# Patient Record
Sex: Male | Born: 1950 | Race: White | Hispanic: No | Marital: Married | Smoking: Current every day smoker
Health system: Southern US, Community
[De-identification: ages and names within clinical notes are randomized; demographics above are authoritative.]

## PROBLEM LIST (undated history)

## (undated) DIAGNOSIS — M7989 Other specified soft tissue disorders: Secondary | ICD-10-CM

## (undated) DIAGNOSIS — I10 Essential (primary) hypertension: Secondary | ICD-10-CM

## (undated) DIAGNOSIS — R9431 Abnormal electrocardiogram [ECG] [EKG]: Secondary | ICD-10-CM

## (undated) HISTORY — DX: Essential (primary) hypertension: I10

## (undated) HISTORY — PX: VASECTOMY: SHX75

## (undated) HISTORY — PX: TONSILLECTOMY AND ADENOIDECTOMY: SUR1326

---
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
## 2019-08-02 NOTE — Progress Notes (Signed)
Cardiology Office Note:    Date:  08/03/2019   ID:  Eric SchmidtHouston Molina, [place] [birth year], MRN [id number]  PCP:  Baldo AshMartin, Michael, FNP  Cardiologist:  Norman HerrlichBrian Munley, MD   Referring MD: Baldo AshMartin, Michael, FNP  ASSESSMENT:    1. Abnormal electrocardiogram   2. Essential hypertension   3. Cigarette smoker    PLAN:    In order of problems listed above:  1. Abnormal EKG, we discussed that as a screening test unfortunately and often leaves you with nonspecific findings we discussed the option of further evaluation and for better screen for CAD he will undergo a coronary calcium score.  If high risk I would do an ischemia evaluation. 2. Hypertension stable 3. Cigarette smoking advised to cease, I am uncertain but I asked him to discuss with his PCP if he qualifies for duplex abdominal aorta entering Medicare with cigarette smoking and hypertension for abdominal aortic aneurysm.  Next appointment as needed  Medication Adjustments/Labs and Tests Ordered: Current medicines are reviewed at length with the patient today.  Concerns regarding medicines are outlined above.  Orders Placed This Encounter  Procedures  . CT CARDIAC SCORING  . EKG 12-Lead   No orders of the defined types were placed in this encounter.    Chief Complaint  Patient presents with  . Abnormal ECG    History of Present Illness:    Eric Molina is a 68 y.o. male who is being seen today for the evaluation of an abnormal EKG at the request of Baldo AshMartin, Michael, FNP.  With his welcome to Medicare examination an EKG was performed which showed nonspecific ST abnormality.  His cardiovascular risk include hypertension age and ongoing cigarette smoking he has no known history of heart disease never had a heart murmur congenital or rheumatic.  He is a vigorous active man and has no shortness of breath exercise intolerance chest pain palpitation or syncope his only complaint is chronic swelling in the right ankle.  Repeat EKG in my office  today shows sinus rhythm nonspecific conduction delay and ST abnormality.  EKG is not specific for either left ventricular hypertrophy or CAD.  He has no background history of previous electrocardiograms Past Medical History:  Diagnosis Date  . Abnormal EKG 08/03/2019  . Hypertension   . Swelling of right foot 08/03/2019    Past Surgical History:  Procedure Laterality Date  . TONSILLECTOMY AND ADENOIDECTOMY    . VASECTOMY      Current Medications: Current Meds  Medication Sig  . amLODipine (NORVASC) 10 MG tablet TAKE 1 TABLET BY MOUTH ONCE DAILY FOR 30 DAYS  . chlorthalidone (HYGROTON) 25 MG tablet TAKE 1 TABLET BY MOUTH ONCE DAILY FOR 30 DAYS     Allergies:   Patient has no known allergies.   Social History   Socioeconomic History  . Marital status: Married    Spouse name: Not on file  . Number of children: Not on file  . Years of education: Not on file  . Highest education level: Not on file  Occupational History  . Not on file  Social Needs  . Financial resource strain: Not on file  . Food insecurity    Worry: Not on file    Inability: Not on file  . Transportation needs    Medical: Not on file    Non-medical: Not on file  Tobacco Use  . Smoking status: Current Every Day Smoker    Packs/day: 1.00    Years: 45.00    Pack years:  45.00    Types: Cigarettes  . Smokeless tobacco: Never Used  Substance and Sexual Activity  . Alcohol use: Yes    Comment: very seldom  . Drug use: Never  . Sexual activity: Not on file  Lifestyle  . Physical activity    Days per week: Not on file    Minutes per session: Not on file  . Stress: Not on file  Relationships  . Social Herbalist on phone: Not on file    Gets together: Not on file    Attends religious service: Not on file    Active member of club or organization: Not on file    Attends meetings of clubs or organizations: Not on file    Relationship status: Not on file  Other Topics Concern  . Not on file   Social History Narrative  . Not on file     Family History: The patient's family history includes Breast cancer in his mother; Cancer in his father; Diabetes in his sister.  ROS:   Review of Systems  Constitution: Negative.  HENT: Negative.   Eyes: Negative.   Cardiovascular: Positive for leg swelling.  Respiratory: Negative.   Endocrine: Negative.   Hematologic/Lymphatic: Negative.   Skin: Negative.   Musculoskeletal: Negative.   Gastrointestinal: Negative.   Genitourinary: Negative.   Neurological: Negative.   Psychiatric/Behavioral: Negative.   Allergic/Immunologic: Negative.    Please see the history of present illness.     All other systems reviewed and are negative.  EKGs/Labs/Other Studies Reviewed:    The following studies were reviewed today:   EKG:  EKG is  ordered today.  The ekg ordered today is personally reviewed and demonstrates sinus rhythm nonspecific conduction delay nonspecific ST abnormality  Recent Labs: Cholesterol 188 HDL 24 LDL 103 hemoglobin 16.1 CMP is normal including renal function and liver function.  Physical Exam:    VS:  BP 112/80 (BP Location: Right Arm, Patient Position: Sitting, Cuff Size: Large)   Pulse 73   Ht 5\' 11"  (1.803 m)   Wt 223 lb (101.2 kg)   SpO2 96%   BMI 31.10 kg/m     Wt Readings from Last 3 Encounters:  08/03/19 223 lb (101.2 kg)     GEN:  Well nourished, well developed in no acute distress HEENT: Normal NECK: No JVD; No carotid bruits LYMPHATICS: No lymphadenopathy CARDIAC: RRR, no murmurs, rubs, gallops RESPIRATORY:  Clear to auscultation without rales, wheezing or rhonchi  ABDOMEN: Soft, non-tender, non-distended his abdominal aorta is not palpable  mUSCULOSKELETAL: Isolated to the left ankle left foot chronic edema; No deformity  SKIN: Warm and dry NEUROLOGIC:  Alert and oriented x 3 PSYCHIATRIC:  Normal affect     Signed, Shirlee More, MD  08/03/2019 5:06 PM    [facility]

## 2019-08-03 ENCOUNTER — Other Ambulatory Visit: Payer: Self-pay

## 2019-08-03 ENCOUNTER — Ambulatory Visit (INDEPENDENT_AMBULATORY_CARE_PROVIDER_SITE_OTHER): Payer: Medicare Other | Admitting: Cardiology

## 2019-08-03 DIAGNOSIS — I10 Essential (primary) hypertension: Secondary | ICD-10-CM | POA: Insufficient documentation

## 2019-08-03 DIAGNOSIS — M7989 Other specified soft tissue disorders: Secondary | ICD-10-CM

## 2019-08-03 DIAGNOSIS — F17210 Nicotine dependence, cigarettes, uncomplicated: Secondary | ICD-10-CM | POA: Insufficient documentation

## 2019-08-03 DIAGNOSIS — R9431 Abnormal electrocardiogram [ECG] [EKG]: Secondary | ICD-10-CM

## 2019-08-03 HISTORY — DX: Other specified soft tissue disorders: M79.89

## 2019-08-03 HISTORY — DX: Abnormal electrocardiogram (ECG) (EKG): R94.31

## 2019-08-03 NOTE — Patient Instructions (Signed)
Medication Instructions:  Your physician recommends that you continue on your current medications as directed. Please refer to the Current Medication list given to you today.  If you need a refill on your cardiac medications before your next appointment, please call your pharmacy.   Lab work: None  If you have labs (blood work) drawn today and your tests are completely normal, you will receive your results only by: Marland Kitchen MyChart Message (if you have MyChart) OR . A paper copy in the mail If you have any lab test that is abnormal or we need to change your treatment, we will call you to review the results.  Testing/Procedures: You had an EKG today.   You have been scheduled for a CT cardiac scoring at the [facility] office on Tuesday, 09/11/2019 at 1:00 pm. The address is [address] [place], [place] [address]. Please arrive at 12:45 for testing. This test is not covered by insurance and will cost $150.00 out of pocket. Please bring the payment to your appointment.  Follow-Up: At [facility], you and your health needs are our priority.  As part of our continuing mission to provide you with exceptional heart care, we have created designated Provider Care Teams.  These Care Teams include your primary Cardiologist (physician) and Advanced Practice Providers (APPs -  Physician Assistants and Nurse Practitioners) who all work together to provide you with the care you need, when you need it. You will need a follow up appointment as needed if symptoms worsen or fail to improve.

## 2019-09-11 ENCOUNTER — Other Ambulatory Visit: Payer: Self-pay

## 2019-09-11 ENCOUNTER — Ambulatory Visit (INDEPENDENT_AMBULATORY_CARE_PROVIDER_SITE_OTHER)
Admission: RE | Admit: 2019-09-11 | Discharge: 2019-09-11 | Disposition: A | Discharge status: Home / Self Care | Payer: Self-pay | Source: Ambulatory Visit | Attending: Cardiology | Admitting: Cardiology

## 2019-09-11 DIAGNOSIS — R9431 Abnormal electrocardiogram [ECG] [EKG]: Secondary | ICD-10-CM

## 2020-10-20 IMAGING — CT CT HEART SCORING
2 series · 15 of 20 positions shown, 17 images · non-contrast
Comparison: None.
COMPARISON: None.

Addendum:
EXAM:
OVER-READ INTERPRETATION  CT CHEST

The following report is an over-read performed by radiologist Dr.
Ebadat Tiger [REDACTED] on 09/11/2019. This over-read
does not include interpretation of cardiac or coronary anatomy or
pathology. The calcium score interpretation by the cardiologist is
attached.
CLINICAL DATA: Risk stratification
Coronary Calcium Score
TECHNIQUE: The patient was scanned on a Siemens Force scanner. Axial
non-contrast 3 mm slices were carried out through the heart. The
data set was analyzed on a dedicated work station and scored using
the Agatson method.

[Series 2: casc 3.0 i36f 2 bestdiast 68 % · axial · 0.43mm/px · z∈[-297,-171]mm · 8 of 55 slices shown, 10 images]
[im 7/55  vessel]
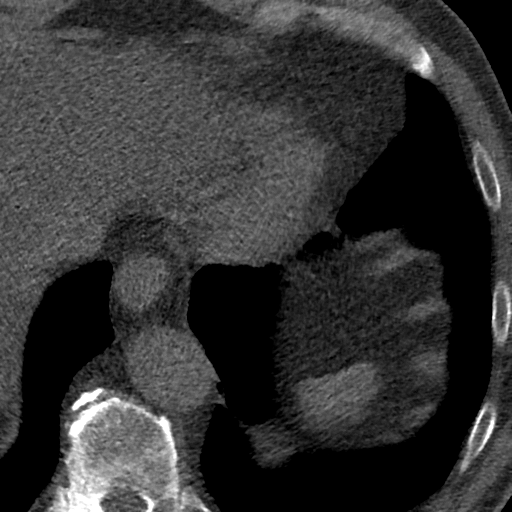
[im 7/55  lung]
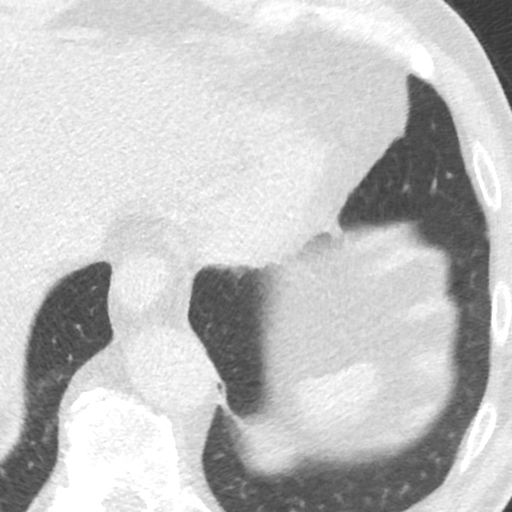
[im 13/55  vessel]
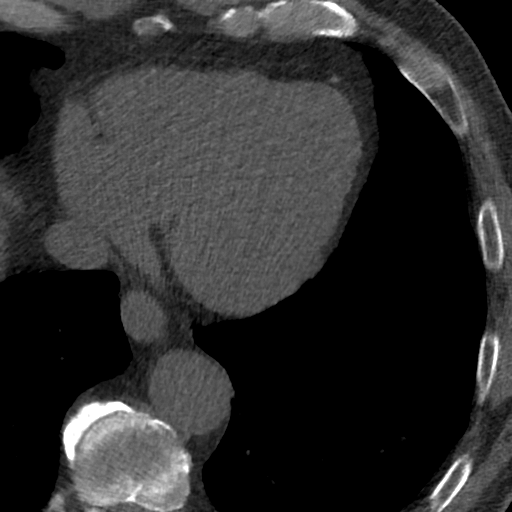
[im 19/55  vessel]
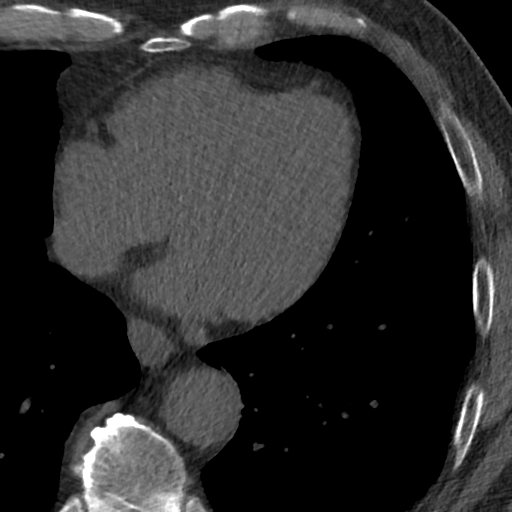
[im 25/55  vessel]
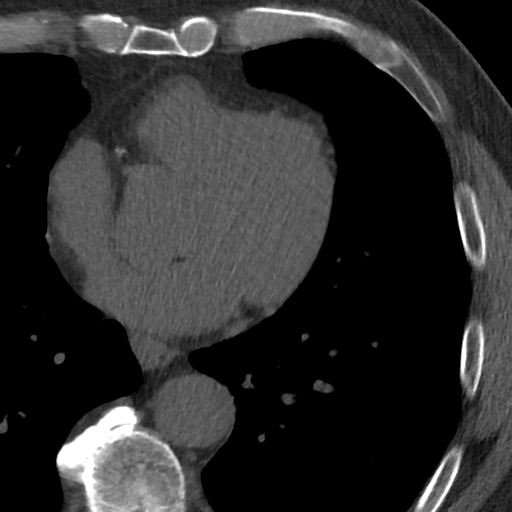
[im 31/55  vessel]
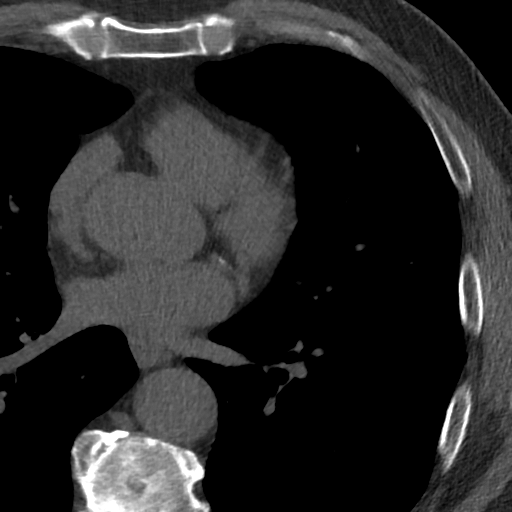
[im 31/55  lung]
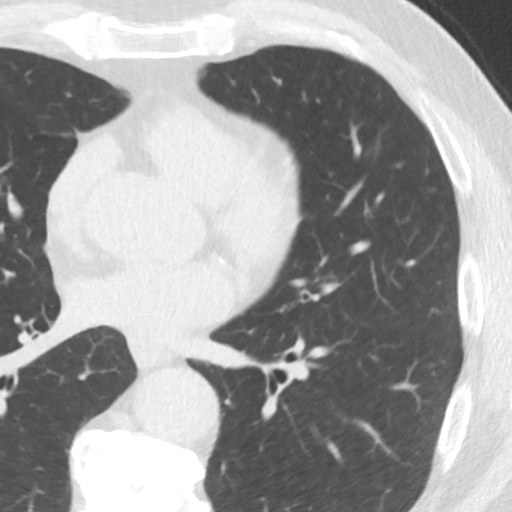
[im 37/55  vessel]
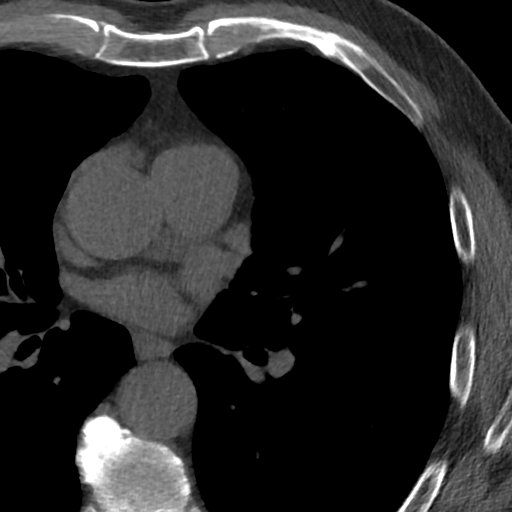
[im 43/55  vessel]
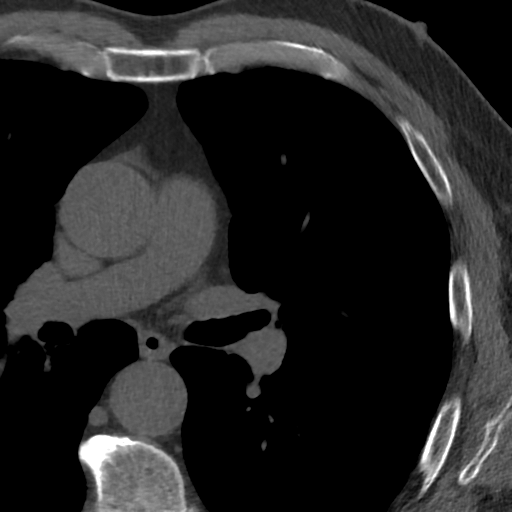
[im 49/55  vessel]
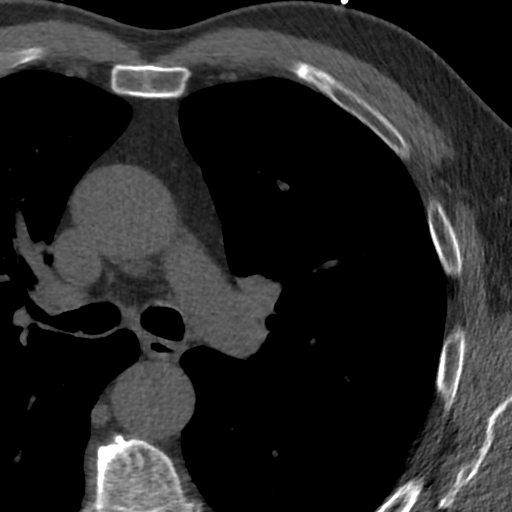

[Series 4: lung st 70 % · axial · 0.75mm/px · z∈[-296,-188]mm · 7 of 55 slices shown]
[im 7/55  lung]
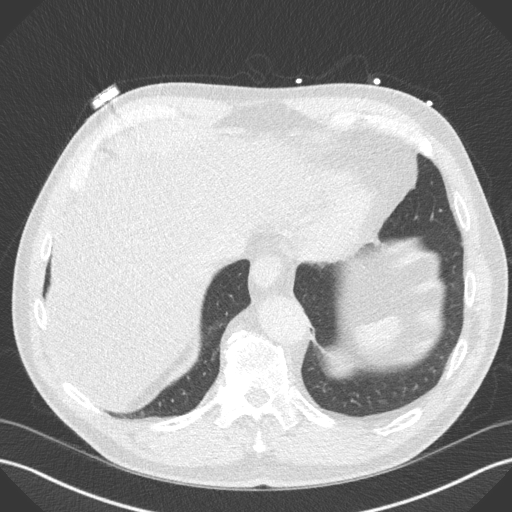
[im 13/55  lung]
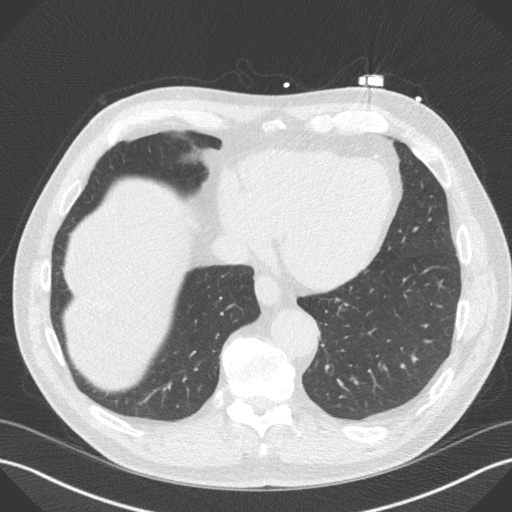
[im 19/55  lung]
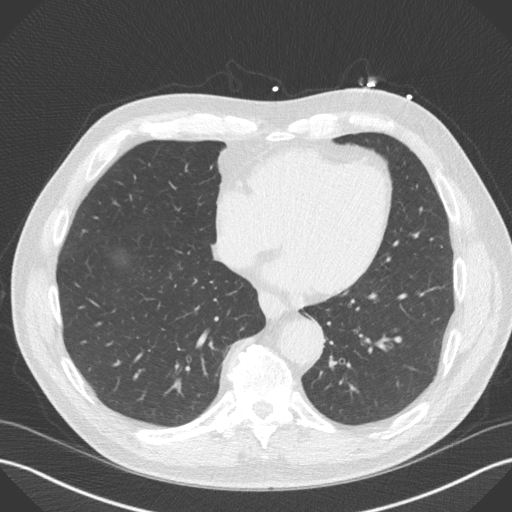
[im 25/55  lung]
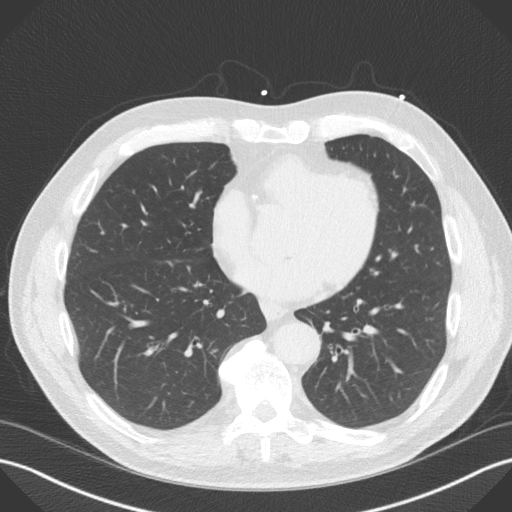
[im 31/55  lung]
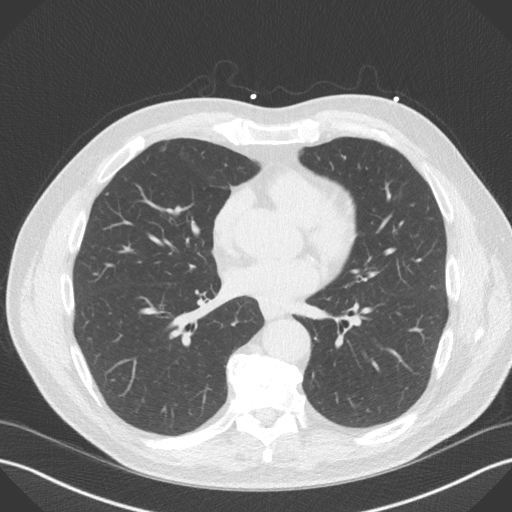
[im 37/55  lung]
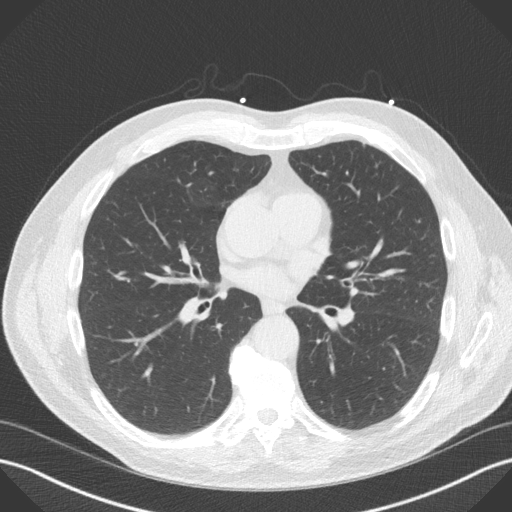
[im 43/55  lung]
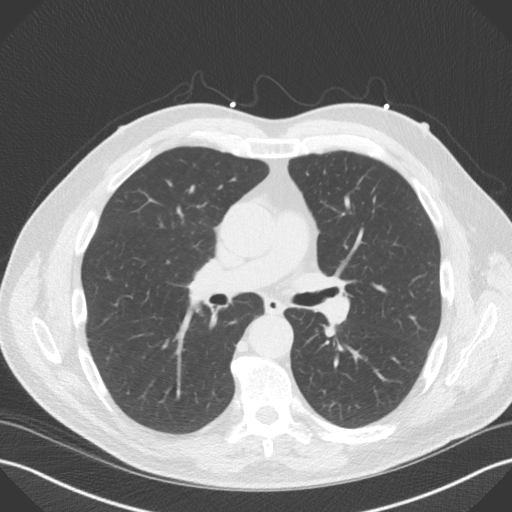

[15 of 20 positions shown; findings below may reference images not displayed]

FINDINGS: Vascular: Tortuous thoracic aorta.  Aortic atherosclerosis.

Mediastinum/Nodes: No imaged thoracic adenopathy.

Lungs/Pleura: No imaged pleural fluid. Moderate centrilobular
emphysema. Right-sided pulmonary nodules are identified on series 3,
including at maximally 5 mm on [DATE].

Upper Abdomen: Marked hepatic steatosis. Normal imaged portions of
the spleen, stomach.

Musculoskeletal: Moderate thoracic spondylosis.
IMPRESSION: 1.  No acute findings in the imaged extracardiac chest.
2. Aortic atherosclerosis (RMZAB-0LQ.Q) and emphysema (RMZAB-JG7.R).
3. pulmonary nodules of maximally 5 mm. Given emphysema, chest CT
follow-up at 12 months should be considered. Alternatively, complete
diagnostic chest CT imaging could be performed as a baseline more
acutely. This recommendation follows the consensus statement:
Guidelines for Management of Incidental Pulmonary Nodules Detected
on CT Images:From the [HOSPITAL] 2115; published online
before print (10.1148/[id number]).
4. Hepatic steatosis.
FINDINGS: Non-cardiac: See separate report from [REDACTED].

Ascending Aorta: Mild ascending aortic aneurysm measuring 41 mm.

Pericardium: Normal.

Coronary arteries: Normal origin.
IMPRESSION: 1. Coronary calcium score of 217. This was 66 percentile for age and
sex matched control.

2. Mild ascending aortic aneurysm measuring 41 mm. Annual imaging
with CTA or MRA is recommended.

*** End of Addendum ***
EXAM:
OVER-READ INTERPRETATION  CT CHEST

The following report is an over-read performed by radiologist Dr.
Ebadat Tiger [REDACTED] on 09/11/2019. This over-read
does not include interpretation of cardiac or coronary anatomy or
pathology. The calcium score interpretation by the cardiologist is
attached.
FINDINGS: Vascular: Tortuous thoracic aorta.  Aortic atherosclerosis.

Mediastinum/Nodes: No imaged thoracic adenopathy.

Lungs/Pleura: No imaged pleural fluid. Moderate centrilobular
emphysema. Right-sided pulmonary nodules are identified on series 3,
including at maximally 5 mm on [DATE].

Upper Abdomen: Marked hepatic steatosis. Normal imaged portions of
the spleen, stomach.

Musculoskeletal: Moderate thoracic spondylosis.
IMPRESSION: 1.  No acute findings in the imaged extracardiac chest.
2. Aortic atherosclerosis (RMZAB-0LQ.Q) and emphysema (RMZAB-JG7.R).
3. pulmonary nodules of maximally 5 mm. Given emphysema, chest CT
follow-up at 12 months should be considered. Alternatively, complete
diagnostic chest CT imaging could be performed as a baseline more
acutely. This recommendation follows the consensus statement:
Guidelines for Management of Incidental Pulmonary Nodules Detected
on CT Images:From the [HOSPITAL] 2115; published online
before print (10.1148/[id number]).
4. Hepatic steatosis.

## 2023-01-26 ENCOUNTER — Ambulatory Visit: Payer: Medicare Other | Admitting: Internal Medicine

## 2023-01-26 ENCOUNTER — Encounter: Payer: Self-pay | Admitting: Internal Medicine

## 2023-01-26 VITALS — BP 128/86 | HR 69 | Temp 97.3°F | Resp 18 | Ht 71.0 in | Wt 225.8 lb

## 2023-01-26 DIAGNOSIS — Z87891 Personal history of nicotine dependence: Secondary | ICD-10-CM

## 2023-01-26 DIAGNOSIS — E7800 Pure hypercholesterolemia, unspecified: Secondary | ICD-10-CM

## 2023-01-26 DIAGNOSIS — I10 Essential (primary) hypertension: Secondary | ICD-10-CM

## 2023-01-26 DIAGNOSIS — F17210 Nicotine dependence, cigarettes, uncomplicated: Secondary | ICD-10-CM

## 2023-01-26 MED ORDER — LOSARTAN POTASSIUM 100 MG PO TABS: 100.0000 mg | ORAL_TABLET | Freq: Every day | ORAL | 3 refills | Status: DC

## 2023-01-26 NOTE — Assessment & Plan Note (Signed)
We will check his FLP today.   He is not on pravastatin and we will see what his cholesterol is doing.

## 2023-01-26 NOTE — Assessment & Plan Note (Signed)
His BP is currently controlled.  We will continue on his lisinopril.

## 2023-01-26 NOTE — Assessment & Plan Note (Signed)
I had a discussion with him regarding tobacco cessation.

## 2023-01-26 NOTE — Progress Notes (Signed)
Office Visit  Subjective   Patient ID: Eric Molina   DOB: 10-Apr-1951   Age: 72 y.o.   MRN: [id number]   Chief Complaint Chief Complaint  Patient presents with   Follow-up    Fasting     History of Present Illness The patient is a 72 year old Caucasian/White male who presents for a follow-up evaluation of hypertension.  Since his last visit, he has not had any problems.  The patient does not check his blood pressure at home. The patient's current medications include: losartan 114m daily.  He has been on amlodipine in the past but this causes pruritis.  The patient denies any headache, visual changes, dizziness, lightheadness, chest pain, shortness of breath, weakness/numbness, and edema. He reports there have been no other symptoms noted.   Eric Molina returns today for routine followup on his cholesterol.  In 01/2022, his ASCVD score was 29.5% where we started him on pravastatin.  We also noted he had prediabetes.  I wanted him to start pravastatin, cut back on sugars and fat and exercise.   Overall, he states he is doing well and is without any complaints or problems at this time. He specifically denies abdominal pain, nausea, vomiting, diarrhea, and fatigue. He remains on dietary management as well as a regular exercise program but he has not been taking the pravastatin and he stopped it due to myalgias.   He is fasting in anticipation for labs today.   He does have a history of tobacco abuse where he started smoking at age 72  He smokes 1/2 ppd.  He does have a mild chronic cough but no SOB, wheezing, or hemoptysis.  He has tried chantix in the past but this made him have nightmares.  He has never tried nicotine patches.          Past Medical History Past Medical History:  Diagnosis Date   Abnormal EKG 08/03/2019   Hypertension    Swelling of right foot 08/03/2019     Allergies Allergies  Allergen Reactions   Amlodipine Rash     Medications  Current Outpatient  Medications:    losartan (COZAAR) 100 MG tablet, Take 100 mg by mouth daily., Disp: , Rfl:    pravastatin (PRAVACHOL) 40 MG tablet, Take 40 mg by mouth daily. (Patient not taking: Reported on 01/26/2023), Disp: , Rfl:    Review of Systems Review of Systems  Constitutional:  Negative for chills and fever.  Eyes:  Negative for blurred vision and double vision.  Respiratory:  Negative for cough and shortness of breath.   Cardiovascular:  Negative for chest pain, palpitations and leg swelling.  Gastrointestinal:  Negative for abdominal pain, constipation, diarrhea, nausea and vomiting.  Musculoskeletal:  Negative for myalgias.  Neurological:  Negative for dizziness, weakness and headaches.  Psychiatric/Behavioral:  Negative for depression. The patient is not nervous/anxious.        Objective:    Vitals BP 128/86 (BP Location: Left Arm, Patient Position: Sitting, Cuff Size: Normal)   Pulse 69   Temp (!) 97.3 F (36.3 C) (Temporal)   Resp 18   Ht 5' 11"$  (1.803 m)   Wt 225 lb 12.8 oz (102.4 kg)   SpO2 98%   BMI 31.49 kg/m    Physical Examination Physical Exam Constitutional:      Appearance: Normal appearance. He is not ill-appearing.  Cardiovascular:     Rate and Rhythm: Normal rate and regular rhythm.     Pulses: Normal pulses.  Heart sounds: No murmur heard.    No friction rub. No gallop.  Pulmonary:     Effort: Pulmonary effort is normal. No respiratory distress.     Breath sounds: No wheezing, rhonchi or rales.  Abdominal:     General: Abdomen is flat. Bowel sounds are normal. There is no distension.     Palpations: Abdomen is soft.     Tenderness: There is no abdominal tenderness.  Musculoskeletal:     Right lower leg: No edema.     Left lower leg: No edema.  Skin:    General: Skin is warm and dry.     Findings: No rash.  Neurological:     General: No focal deficit present.     Mental Status: He is alert and oriented to person, place, and time.  Psychiatric:         Mood and Affect: Mood normal.        Behavior: Behavior normal.        Assessment & Plan:   Essential hypertension His BP is currently controlled.  We will continue on his lisinopril.  Cigarette smoker I had a discussion with him regarding tobacco cessation.  Hypercholesterolemia We will check his FLP today.   He is not on pravastatin and we will see what his cholesterol is doing.    Return in about 3 months (around 04/26/2023) for annual.   Townsend Roger, MD

## 2023-01-27 LAB — CMP14 + ANION GAP
ALT: 31 IU/L (ref 0–44)
AST: 21 IU/L (ref 0–40)
Albumin/Globulin Ratio: 1.2 (ref 1.2–2.2)
Albumin: 4.3 g/dL (ref 3.9–4.9)
Alkaline Phosphatase: 65 IU/L (ref 44–121)
Anion Gap: 15 mmol/L (ref 10.0–18.0)
BUN/Creatinine Ratio: 11 (ref 10–24)
BUN: 15 mg/dL (ref 8–27)
Bilirubin Total: 0.7 mg/dL (ref 0.0–1.2)
CO2: 23 mmol/L (ref 20–29)
Calcium: 9.8 mg/dL (ref 8.6–10.2)
Chloride: 101 mmol/L (ref 96–106)
Creatinine, Ser: 1.31 mg/dL — ABNORMAL HIGH (ref 0.76–1.27)
Globulin, Total: 3.6 g/dL (ref 1.5–4.5)
Glucose: 109 mg/dL — ABNORMAL HIGH (ref 70–99)
Potassium: 5 mmol/L (ref 3.5–5.2)
Sodium: 139 mmol/L (ref 134–144)
Total Protein: 7.9 g/dL (ref 6.0–8.5)
eGFR: 59 mL/min/{1.73_m2} — ABNORMAL LOW (ref >59)

## 2023-01-27 LAB — LIPID PANEL
Chol/HDL Ratio: 7.2 ratio — ABNORMAL HIGH (ref 0.0–5.0)
Cholesterol, Total: 188 mg/dL (ref 100–199)
HDL: 26 mg/dL — ABNORMAL LOW (ref >39)
LDL Chol Calc (NIH): 128 mg/dL — ABNORMAL HIGH (ref 0–99)
Triglycerides: 188 mg/dL — ABNORMAL HIGH (ref 0–149)
VLDL Cholesterol Cal: 34 mg/dL (ref 5–40)

## 2023-01-31 ENCOUNTER — Other Ambulatory Visit: Payer: Self-pay

## 2023-02-01 ENCOUNTER — Other Ambulatory Visit: Payer: Self-pay

## 2023-02-01 MED ORDER — ATORVASTATIN CALCIUM 20 MG PO TABS: 20.0000 mg | ORAL_TABLET | ORAL | 3 refills | Status: DC

## 2023-02-02 NOTE — Progress Notes (Signed)
Patient called.  Patient aware.     The patient has CKD which is unchanged.  His cholesterol is elevated.  He needs to start lipitor '20mg'$  every other day.  WE will see if this cause myalgias.

## 2023-05-12 ENCOUNTER — Encounter: Payer: Medicare Other | Admitting: Internal Medicine

## 2023-06-28 ENCOUNTER — Ambulatory Visit: Payer: Medicare Other | Admitting: Internal Medicine

## 2023-06-28 ENCOUNTER — Encounter: Payer: Self-pay | Admitting: Internal Medicine

## 2023-06-28 VITALS — BP 118/70 | HR 82 | Temp 97.8°F | Resp 16 | Ht 71.0 in | Wt 216.6 lb

## 2023-06-28 DIAGNOSIS — N529 Male erectile dysfunction, unspecified: Secondary | ICD-10-CM | POA: Insufficient documentation

## 2023-06-28 DIAGNOSIS — Z0000 Encounter for general adult medical examination without abnormal findings: Secondary | ICD-10-CM

## 2023-06-28 DIAGNOSIS — Z6830 Body mass index (BMI) 30.0-30.9, adult: Secondary | ICD-10-CM | POA: Insufficient documentation

## 2023-06-28 DIAGNOSIS — I10 Essential (primary) hypertension: Secondary | ICD-10-CM | POA: Diagnosis not present

## 2023-06-28 DIAGNOSIS — Z1331 Encounter for screening for depression: Secondary | ICD-10-CM | POA: Diagnosis not present

## 2023-06-28 DIAGNOSIS — Z87891 Personal history of nicotine dependence: Secondary | ICD-10-CM

## 2023-06-28 DIAGNOSIS — R3911 Hesitancy of micturition: Secondary | ICD-10-CM

## 2023-06-28 DIAGNOSIS — F17210 Nicotine dependence, cigarettes, uncomplicated: Secondary | ICD-10-CM

## 2023-06-28 DIAGNOSIS — E7800 Pure hypercholesterolemia, unspecified: Secondary | ICD-10-CM | POA: Diagnosis not present

## 2023-06-28 DIAGNOSIS — E6609 Other obesity due to excess calories: Secondary | ICD-10-CM

## 2023-06-28 DIAGNOSIS — E559 Vitamin D deficiency, unspecified: Secondary | ICD-10-CM

## 2023-06-28 DIAGNOSIS — E66811 Obesity, class 1: Secondary | ICD-10-CM | POA: Insufficient documentation

## 2023-06-28 NOTE — Assessment & Plan Note (Signed)
His BP looks excellent today.  He will continue on his losartan.

## 2023-06-28 NOTE — Assessment & Plan Note (Signed)
WE discussed tobacco cessation.  I want him to cut back on his smoking.

## 2023-06-28 NOTE — Assessment & Plan Note (Signed)
As above.

## 2023-06-28 NOTE — Assessment & Plan Note (Signed)
He is now on lipitor 20mg  every other day.  We will check his FLP today.

## 2023-06-28 NOTE — Assessment & Plan Note (Signed)
We will check his Vit D level today.

## 2023-06-28 NOTE — Assessment & Plan Note (Signed)
He is not on any medications at this time.

## 2023-06-28 NOTE — Progress Notes (Signed)
Preventive Screening-Counseling & Management     Eric Molina is a 72 y.o. male who presents for Medicare Annual/Subsequent preventive examination.  Eric Molina is a 72 year old Caucasian/White male who presents for his annual wellness exam. He is due for the following health maintenance studies: screening labs. This patient's past medical history Hypertension and HCL.   His last eye exam was in 11/2022 and he states his vision is doing well.  He had surgery in 2021 for a retinal tear. They are still following him for cataracts. He had a cologuard done in 01/15/2021 that was abnormal. He was sent to GI who performed a colonoscopy on 03/06/2021 and this was normal. There is no family history of colorectal cancer. He is not sure if his dad had prostate cancer. The patient denies any problems with urination. He does exercise by walking and walking on the ellipitcal daily. He does not get yearly flu vaccines. He has never had a pneumonia or shingles vaccines. He has not had the COVID vaccine.  He does not want any vaccinations.  There is no depression, anxiety or memory loss. He is not on an ASA.   The patient is a 72 year old Caucasian/White male who presents for a follow-up evaluation of hypertension.  Since his last visit, he has not had any problems.  The patient does not check his blood pressure at home. The patient's current medications include: losartan 100mg  daily.  He has been on amlodipine in the past but this causes pruritis.  The patient denies any headache, visual changes, dizziness, lightheadness, chest pain, shortness of breath, weakness/numbness, and edema. He reports there have been no other symptoms noted.    Eric Molina returns today for routine followup on his cholesterol.  On his last visit, his cholesterol was not controlled and I asked him to start on lipitor.  In 01/2022, his ASCVD score was 29.5% where we started him on pravastatin.  We also noted he had prediabetes.  I wanted  him to start pravastatin, cut back on sugars and fat and exercise.   Overall, he states he is doing well and is without any complaints or problems at this time. He specifically denies abdominal pain, nausea, vomiting, diarrhea, and fatigue. He remains on dietary management as well as a regular exercise program with lipitor 20mg  every other day.   He is fasting in anticipation for labs today.    He does have a history of tobacco abuse where he started smoking at age 50.  He smokes 3/4 ppd.  He does have a mild chronic cough but no SOB, wheezing, or hemoptysis.  He has tried chantix in the past but this made him have nightmares.  He has never tried nicotine patches.  He has tried nicotine gum but this sticks to his teeth.  He also has a history of Vitamin D deficiency.  We checked his levels last year and they were low and I asked him to take supplementation which he took for a few months then stopped.   We also saw him last year where he was having problems with libido as well as erectile dysfunction for years.  He has no libido and states he has problems with obtaining an erection.  He has never tried any meds for this in the past.      Are there smokers in your home (other than you)? Yes  Risk Factors Current exercise habits:  as above   Dietary issues discussed: none  Depression Screen (Note: if answer to either of the following is "Yes", a more complete depression screening is indicated)   Over the past two weeks, have you felt down, depressed or hopeless? No  Over the past two weeks, have you felt little interest or pleasure in doing things? No  Have you lost interest or pleasure in daily life? No  Do you often feel hopeless? No  Do you cry easily over simple problems? No  Activities of Daily Living In your present state of health, do you have any difficulty performing the following activities?:  Driving? No Managing money?  No Feeding yourself? No Getting from bed to chair?  No Climbing a flight of stairs? No Preparing food and eating?: No Bathing or showering? No Getting dressed: No Getting to the toilet? No Using the toilet:No Moving around from place to place: No In the past year have you fallen or had a near fall?:No   Are you sexually active?  Yes  Do you have more than one partner?  No  Hearing Difficulties: No Do you often ask people to speak up or repeat themselves? No Do you experience ringing or noises in your ears? No Do you have difficulty understanding soft or whispered voices? No   Do you feel that you have a problem with memory? No  Do you often misplace items? No  Do you feel safe at home?  Yes  Cognitive Testing  Alert? Yes  Normal Appearance?Yes  Oriented to person? Yes  Place? Yes   Time? Yes  Recall of three objects?  Yes  Can perform simple calculations? Yes  Displays appropriate judgment?Yes  Can read the correct time from a watch face?Yes  Fall Risk Prevention  Any stairs in or around the home? Yes  If so, are there any without handrails? Yes  Home free of loose throw rugs in walkways, pet beds, electrical cords, etc? Yes  Adequate lighting in your home to reduce risk of falls? Yes  Use of a cane, walker or w/c? No    Time Up and Go  Was the test performed? Yes .  Length of time to ambulate 10 feet: 6 sec.   Gait steady and fast without use of assistive device    Advanced Directives have been discussed with the patient? Yes   List the Names of Other Physician/Practitioners you currently use: Patient Care Team: Crist Fat, MD as PCP - General (Internal Medicine)    Past Medical History:  Diagnosis Date   Abnormal EKG 08/03/2019   Hypertension    Swelling of right foot 08/03/2019    Past Surgical History:  Procedure Laterality Date   TONSILLECTOMY AND ADENOIDECTOMY     VASECTOMY        Current Medications  Current Outpatient Medications  Medication Sig Dispense Refill   atorvastatin (LIPITOR)  20 MG tablet Take 1 tablet (20 mg total) by mouth every other day. 15 tablet 3   losartan (COZAAR) 100 MG tablet Take 1 tablet (100 mg total) by mouth daily. 90 tablet 3   No current facility-administered medications for this visit.    Allergies Amlodipine   Social History Social History   Tobacco Use   Smoking status: Every Day    Current packs/day: 1.00    Average packs/day: 1 pack/day for 45.0 years (45.0 ttl pk-yrs)    Types: Cigarettes   Smokeless tobacco: Never  Substance Use Topics   Alcohol use: Yes    Comment: very seldom  Review of Systems Review of Systems  Constitutional:  Negative for chills, fever, malaise/fatigue and weight loss.  HENT:  Negative for hearing loss and tinnitus.   Eyes:  Negative for blurred vision and double vision.  Respiratory:  Negative for cough, hemoptysis, shortness of breath and wheezing.   Cardiovascular:  Negative for chest pain, palpitations and leg swelling.  Gastrointestinal:  Negative for abdominal pain, blood in stool, constipation, diarrhea, heartburn, melena, nausea and vomiting.  Genitourinary:  Negative for frequency and hematuria.  Musculoskeletal:  Negative for myalgias.  Skin:  Negative for itching and rash.  Neurological:  Negative for dizziness, weakness and headaches.  Endo/Heme/Allergies:  Negative for polydipsia.  Psychiatric/Behavioral:  Negative for depression. The patient is not nervous/anxious.      Physical Exam:      Body mass index is 30.21 kg/m. BP 118/70   Pulse 82   Temp 97.8 F (36.6 C)   Resp 16   Ht 5\' 11"  (1.803 m)   Wt 216 lb 9.6 oz (98.2 kg)   SpO2 96%   BMI 30.21 kg/m   Physical Exam Constitutional:      Appearance: Normal appearance. He is not ill-appearing.  HENT:     Head: Normocephalic and atraumatic.     Right Ear: Tympanic membrane, ear canal and external ear normal.     Left Ear: Tympanic membrane, ear canal and external ear normal.     Nose: Nose normal. No congestion or  rhinorrhea.     Mouth/Throat:     Mouth: Mucous membranes are dry.     Pharynx: Oropharynx is clear. No oropharyngeal exudate or posterior oropharyngeal erythema.  Eyes:     General: No scleral icterus.    Conjunctiva/sclera: Conjunctivae normal.     Pupils: Pupils are equal, round, and reactive to light.  Neck:     Vascular: No carotid bruit.  Cardiovascular:     Rate and Rhythm: Normal rate and regular rhythm.     Pulses: Normal pulses.     Heart sounds: No murmur heard.    No friction rub. No gallop.  Pulmonary:     Effort: Pulmonary effort is normal. No respiratory distress.     Breath sounds: No wheezing, rhonchi or rales.  Abdominal:     General: Abdomen is flat. Bowel sounds are normal. There is no distension.     Palpations: Abdomen is soft.     Tenderness: There is no abdominal tenderness.  Musculoskeletal:     Cervical back: Neck supple. No tenderness.     Right lower leg: No edema.     Left lower leg: No edema.  Lymphadenopathy:     Cervical: No cervical adenopathy.  Skin:    General: Skin is warm and dry.     Findings: No rash.  Neurological:     General: No focal deficit present.     Mental Status: He is alert and oriented to person, place, and time.  Psychiatric:        Mood and Affect: Mood normal.        Behavior: Behavior normal.      Assessment:      Essential hypertension  Hypercholesterolemia  Cigarette smoker  Vitamin D deficiency  Erectile dysfunction, unspecified erectile dysfunction type  BMI 30.0-30.9,adult  Class 1 obesity due to excess calories with serious comorbidity and body mass index (BMI) of 30.0 to 30.9 in adult  Urinary hesitancy    Plan:     During the course of the visit  the patient was educated and counseled about appropriate screening and preventive services including:   Pneumococcal vaccine  Influenza vaccine Colorectal cancer screening Smoking cessation counseling Advanced Directives  Diet review for  nutrition referral? Yes ____  Not Indicated _x___   Patient Instructions (the written plan) was given to the patient.  Essential hypertension His BP looks excellent today.  He will continue on his losartan.  Vitamin D deficiency We will check his Vit D level today.  Hypercholesterolemia He is now on lipitor 20mg  every other day.  We will check his FLP today.  Erectile dysfunction He is not on any medications at this time.  Class 1 obesity due to excess calories with serious comorbidity and body mass index (BMI) of 30.0 to 30.9 in adult He has obesity associated with HTN and HCL.  I want him to eat healthy, cut his calories and continue to exercise and lose weight.  Cigarette smoker WE discussed tobacco cessation.  I want him to cut back on his smoking.  BMI 30.0-30.9,adult As above.    Prevention   Medicare Attestation I have personally reviewed: The patient's medical and social history Their use of alcohol, tobacco or illicit drugs Their current medications and supplements The patient's functional ability including ADLs,fall risks, home safety risks, cognitive, and hearing and visual impairment Diet and physical activities Evidence for depression or mood disorders  The patient's weight, height, and BMI have been recorded in the chart.  I have made referrals, counseling, and provided education to the patient based on review of the above and I have provided the patient with a written personalized care plan for preventive services.     Crist Fat, MD   06/28/2023

## 2023-06-28 NOTE — Assessment & Plan Note (Signed)
He has obesity associated with HTN and HCL.  I want him to eat healthy, cut his calories and continue to exercise and lose weight.

## 2023-06-29 LAB — CMP14 + ANION GAP
ALT: 35 IU/L (ref 0–44)
AST: 24 IU/L (ref 0–40)
Albumin: 4.4 g/dL (ref 3.9–4.9)
Alkaline Phosphatase: 82 IU/L (ref 44–121)
Anion Gap: 14 mmol/L (ref 10.0–18.0)
BUN/Creatinine Ratio: 12 (ref 10–24)
BUN: 13 mg/dL (ref 8–27)
Bilirubin Total: 0.7 mg/dL (ref 0.0–1.2)
CO2: 25 mmol/L (ref 20–29)
Calcium: 9.5 mg/dL (ref 8.6–10.2)
Chloride: 100 mmol/L (ref 96–106)
Creatinine, Ser: 1.08 mg/dL (ref 0.76–1.27)
Globulin, Total: 3.4 g/dL (ref 1.5–4.5)
Glucose: 99 mg/dL (ref 70–99)
Potassium: 4.8 mmol/L (ref 3.5–5.2)
Sodium: 139 mmol/L (ref 134–144)
Total Protein: 7.8 g/dL (ref 6.0–8.5)
eGFR: 74 mL/min/{1.73_m2} (ref >59)

## 2023-06-29 LAB — LIPID PANEL
Chol/HDL Ratio: 6.9 ratio — ABNORMAL HIGH (ref 0.0–5.0)
Cholesterol, Total: 172 mg/dL (ref 100–199)
HDL: 25 mg/dL — ABNORMAL LOW (ref >39)
LDL Chol Calc (NIH): 106 mg/dL — ABNORMAL HIGH (ref 0–99)
Triglycerides: 234 mg/dL — ABNORMAL HIGH (ref 0–149)
VLDL Cholesterol Cal: 41 mg/dL — ABNORMAL HIGH (ref 5–40)

## 2023-06-29 LAB — CBC WITH DIFFERENTIAL/PLATELET
Basophils Absolute: 0.1 10*3/uL (ref 0.0–0.2)
Basos: 1 %
EOS (ABSOLUTE): 0.4 10*3/uL (ref 0.0–0.4)
Eos: 6 %
Hematocrit: 45.4 % (ref 37.5–51.0)
Hemoglobin: 15.2 g/dL (ref 13.0–17.7)
Immature Grans (Abs): 0 10*3/uL (ref 0.0–0.1)
Immature Granulocytes: 0 %
Lymphocytes Absolute: 2.4 10*3/uL (ref 0.7–3.1)
Lymphs: 32 %
MCH: 30 pg (ref 26.6–33.0)
MCHC: 33.5 g/dL (ref 31.5–35.7)
MCV: 90 fL (ref 79–97)
Monocytes Absolute: 0.6 10*3/uL (ref 0.1–0.9)
Monocytes: 7 %
Neutrophils Absolute: 4.2 10*3/uL (ref 1.4–7.0)
Neutrophils: 54 %
Platelets: 198 10*3/uL (ref 150–450)
RBC: 5.06 x10E6/uL (ref 4.14–5.80)
RDW: 12.2 % (ref 11.6–15.4)
WBC: 7.6 10*3/uL (ref 3.4–10.8)

## 2023-06-29 LAB — PSA: Prostate Specific Ag, Serum: 3 ng/mL (ref 0.0–4.0)

## 2023-06-29 LAB — TSH: TSH: 2.33 u[IU]/mL (ref 0.450–4.500)

## 2023-06-29 LAB — VITAMIN D 25 HYDROXY (VIT D DEFICIENCY, FRACTURES): Vit D, 25-Hydroxy: 19.7 ng/mL — ABNORMAL LOW (ref 30.0–100.0)

## 2023-07-05 ENCOUNTER — Other Ambulatory Visit: Payer: Self-pay

## 2023-07-05 MED ORDER — ATORVASTATIN CALCIUM 40 MG PO TABS: 40.0000 mg | ORAL_TABLET | ORAL | 5 refills | Status: DC

## 2023-07-05 NOTE — Progress Notes (Signed)
Increase his lipitor from 20mg  to 40mg  every other day. His vitamin D level is low. Start Vitamin D 1000 units po daily.  Pt notified

## 2023-10-27 ENCOUNTER — Encounter: Payer: Self-pay | Admitting: Internal Medicine

## 2023-10-27 ENCOUNTER — Ambulatory Visit: Payer: Medicare Other | Admitting: Internal Medicine

## 2023-10-27 VITALS — BP 122/80 | HR 75 | Temp 98.5°F | Resp 18 | Ht 71.0 in | Wt 220.0 lb

## 2023-10-27 DIAGNOSIS — I10 Essential (primary) hypertension: Secondary | ICD-10-CM | POA: Diagnosis not present

## 2023-10-27 DIAGNOSIS — E7800 Pure hypercholesterolemia, unspecified: Secondary | ICD-10-CM

## 2023-10-27 MED ORDER — LOSARTAN POTASSIUM 100 MG PO TABS: 100.0000 mg | ORAL_TABLET | Freq: Every day | ORAL | 3 refills | Status: DC

## 2023-10-27 NOTE — Assessment & Plan Note (Signed)
His BP remains controlled and we will continue on losartan.

## 2023-10-27 NOTE — Assessment & Plan Note (Signed)
I asked him to continue on lipitor.  He has increased his exercise and I want him to eat healthy and cut out fats.

## 2023-10-27 NOTE — Progress Notes (Signed)
Office Visit  Subjective   Patient ID: Eric Molina   DOB: 05-Oct-1951   Age: 72 y.o.   MRN: [id number]   Chief Complaint Chief Complaint  Patient presents with   Follow-up    4 month follow up     History of Present Illness The patient is a 72 year old Caucasian/White male who presents for a follow-up evaluation of hypertension.  The patient does not check his blood pressure at home. The patient's current medications include: losartan 100mg  daily.  He has been on amlodipine in the past but this causes pruritis.  The patient denies any headache, visual changes, dizziness, lightheadness, chest pain, shortness of breath, weakness/numbness, and edema. He reports there have been no other symptoms noted.    Eric Molina returns today for routine followup on his cholesterol.  His TG level was elevated on his last visit in 06/2023 but otherwise his cholesterol was controlled.  This past year. his cholesterol was not controlled and I asked him to start on lipitor.  In 01/2022, his ASCVD score was 29.5% where we started him on pravastatin.  We also noted he had prediabetes.  I wanted him to start pravastatin, cut back on sugars and fat and exercise.   Overall, he states he is doing well and is without any complaints or problems at this time. He specifically denies abdominal pain, nausea, vomiting, diarrhea, and fatigue. He remains on dietary management as well as a regular exercise program with lipitor 20mg  every other day.   He is fasting in anticipation for labs today.       Past Medical History Past Medical History:  Diagnosis Date   Abnormal EKG 08/03/2019   Hypertension    Swelling of right foot 08/03/2019     Allergies Allergies  Allergen Reactions   Amlodipine Rash     Medications  Current Outpatient Medications:    atorvastatin (LIPITOR) 40 MG tablet, Take 1 tablet (40 mg total) by mouth every other day., Disp: 30 tablet, Rfl: 5   cholecalciferol (VITAMIN D3) 25 MCG (1000 UNIT)  tablet, Take 1,000 Units by mouth daily., Disp: , Rfl:    losartan (COZAAR) 100 MG tablet, Take 1 tablet (100 mg total) by mouth daily., Disp: 90 tablet, Rfl: 3   Review of Systems Review of Systems  Constitutional:  Negative for chills, fever and malaise/fatigue.  Eyes:  Negative for blurred vision and double vision.  Respiratory:  Negative for shortness of breath.   Cardiovascular:  Negative for chest pain, palpitations and leg swelling.  Gastrointestinal:  Negative for abdominal pain, diarrhea, nausea and vomiting.  Musculoskeletal:  Negative for myalgias.  Neurological:  Negative for dizziness, weakness and headaches.       Objective:    Vitals BP 122/80 (BP Location: Left Arm, Patient Position: Sitting, Cuff Size: Normal)   Pulse 75   Temp 98.5 F (36.9 C)   Resp 18   Ht 5\' 11"  (1.803 m)   Wt 220 lb (99.8 kg)   SpO2 98%   BMI 30.68 kg/m    Physical Examination Physical Exam Constitutional:      Appearance: Normal appearance. He is not ill-appearing.  Cardiovascular:     Rate and Rhythm: Normal rate and regular rhythm.     Pulses: Normal pulses.     Heart sounds: No murmur heard.    No friction rub. No gallop.  Pulmonary:     Effort: Pulmonary effort is normal. No respiratory distress.     Breath sounds:  No wheezing, rhonchi or rales.  Abdominal:     General: Abdomen is flat. Bowel sounds are normal. There is no distension.     Palpations: Abdomen is soft.     Tenderness: There is no abdominal tenderness.  Musculoskeletal:     Right lower leg: No edema.     Left lower leg: No edema.  Skin:    General: Skin is warm and dry.     Findings: No rash.  Neurological:     Mental Status: He is alert.        Assessment & Plan:   Essential hypertension His BP remains controlled and we will continue on losartan.  Hypercholesterolemia I asked him to continue on lipitor.  He has increased his exercise and I want him to eat healthy and cut out fats.    Return in  about 4 months (around 02/24/2024).   Crist Fat, MD

## 2024-02-23 ENCOUNTER — Ambulatory Visit: Payer: Medicare Other | Admitting: Internal Medicine

## 2024-02-23 ENCOUNTER — Encounter: Payer: Self-pay | Admitting: Internal Medicine

## 2024-02-23 VITALS — BP 146/82 | HR 68 | Temp 97.6°F | Resp 18 | Ht 71.0 in | Wt 227.0 lb

## 2024-02-23 DIAGNOSIS — F17210 Nicotine dependence, cigarettes, uncomplicated: Secondary | ICD-10-CM

## 2024-02-23 DIAGNOSIS — R7303 Prediabetes: Secondary | ICD-10-CM | POA: Diagnosis not present

## 2024-02-23 DIAGNOSIS — I10 Essential (primary) hypertension: Secondary | ICD-10-CM | POA: Diagnosis not present

## 2024-02-23 DIAGNOSIS — E7800 Pure hypercholesterolemia, unspecified: Secondary | ICD-10-CM | POA: Diagnosis not present

## 2024-02-23 NOTE — Assessment & Plan Note (Signed)
 I had a discussion about the risks of high cholesterol, prediabetes, HTN and smoking.  We discussed his ASCVD score.  He states he will try to take the pravastatin more often.

## 2024-02-23 NOTE — Assessment & Plan Note (Signed)
 His BP is a little elevated today but it has been controlled every time this past year.  We will see what his BP is doing on his next visit.

## 2024-02-23 NOTE — Assessment & Plan Note (Signed)
 We will check his HGBA1c on his yearly exam which is his next visit.

## 2024-02-23 NOTE — Progress Notes (Signed)
 Office Visit  Subjective   Patient ID: Eric Molina   DOB: 01-Jul-1951   Age: 73 y.o.   MRN: [id number]   Chief Complaint Chief Complaint  Patient presents with   Follow-up    4 Month Follow Up     History of Present Illness The patient is a 73 year old Caucasian/White male who presents for a follow-up evaluation of hypertension.  Since his last visit, he has had no problems.  The patient has been checking his blood pressure at home randomly.  He states his SBP is runnhing in the 140's.. The patient's current medications include: losartan 100mg  daily.  He has been on amlodipine in the past but this causes pruritis.  The patient denies any headache, visual changes, dizziness, lightheadness, chest pain, shortness of breath, weakness/numbness, and edema. He reports there have been no other symptoms noted.    Eric Molina returns today for routine followup on his cholesterol.  His TG level was elevated on his last visit in 06/2023 but otherwise his cholesterol was controlled.  This past year. his cholesterol was not controlled and I asked him to start on lipitor.  In 01/2022, his ASCVD score was 29.5% where we started him on pravastatin.  We also noted he had prediabetes.  I wanted him to start pravastatin, cut back on sugars and fat and exercise.   This past year we did switch his pravastatin to lipitor but he admits to me he never did this and he stopped the pravastatin as "he heard on the news that it could effect his liver".  He has not read anything about statins.  He has almost completely stopped his pravastatin.   He specifically denies abdominal pain, nausea, vomiting, diarrhea, and fatigue. He remains on dietary management as well as a regular exercise program and takes his pravastatin rarely.   He is fasting in anticipation for labs today.   He does have a history of tobacco abuse where he started smoking at age 68.  He smokes 1/2 ppd and has been continuously cutting back..  He does have a  mild chronic cough but no SOB, wheezing, or hemoptysis.  He has tried chantix in the past but this made him have nightmares.  He has never tried nicotine patches.  He has tried nicotine gum but this sticks to his teeth.  The patient is a 73 yo male who returns for followup of his prediabetes.   Since the last visit, there have been no problems. He is currently not on any medications; he is controlling his prediabetes through diet and exercise.  He is now playing golf once a week.  He specifically denies unexplained abdominal pain, nausea or vomiting. He does not routinely check blood sugars. He came in fasting today in anticipation of lab work.      Past Medical History Past Medical History:  Diagnosis Date   Abnormal EKG 08/03/2019   Hypertension    Swelling of right foot 08/03/2019     Allergies Allergies  Allergen Reactions   Amlodipine Rash     Medications  Current Outpatient Medications:    cholecalciferol (VITAMIN D3) 25 MCG (1000 UNIT) tablet, Take 1,000 Units by mouth daily., Disp: , Rfl:    losartan (COZAAR) 100 MG tablet, Take 1 tablet (100 mg total) by mouth daily., Disp: 90 tablet, Rfl: 3   Review of Systems Review of Systems  Constitutional:  Negative for chills, fever and malaise/fatigue.  Eyes:  Negative for blurred vision and double  vision.  Respiratory:  Negative for shortness of breath.   Cardiovascular:  Negative for chest pain and palpitations.  Gastrointestinal:  Negative for abdominal pain, constipation, diarrhea, nausea and vomiting.  Genitourinary:  Negative for frequency.  Musculoskeletal:  Negative for myalgias.  Skin:  Negative for itching and rash.  Neurological:  Negative for dizziness, weakness and headaches.  Endo/Heme/Allergies:  Negative for polydipsia.       Objective:    Vitals BP (!) 146/82 (BP Location: Left Arm, Patient Position: Sitting, Cuff Size: Large)   Pulse 68   Temp 97.6 F (36.4 C) (Oral)   Resp 18   Ht 5\' 11"  (1.803 m)    Wt 227 lb (103 kg)   SpO2 98%   BMI 31.66 kg/m    Physical Examination Physical Exam Constitutional:      Appearance: Normal appearance. He is not ill-appearing.  Cardiovascular:     Rate and Rhythm: Normal rate and regular rhythm.     Pulses: Normal pulses.     Heart sounds: No murmur heard.    No friction rub. No gallop.  Pulmonary:     Effort: Pulmonary effort is normal. No respiratory distress.     Breath sounds: No wheezing, rhonchi or rales.  Abdominal:     General: Abdomen is flat. Bowel sounds are normal. There is no distension.     Palpations: Abdomen is soft.     Tenderness: There is no abdominal tenderness.  Musculoskeletal:     Right lower leg: No edema.     Left lower leg: No edema.  Skin:    General: Skin is warm and dry.     Findings: No rash.  Neurological:     General: No focal deficit present.     Mental Status: He is alert and oriented to person, place, and time.  Psychiatric:        Mood and Affect: Mood normal.        Behavior: Behavior normal.        Assessment & Plan:   Essential hypertension His BP is a little elevated today but it has been controlled every time this past year.  We will see what his BP is doing on his next visit.  Cigarette smoker He has cut back from 1.5 ppd to 1/2 ppd.  I have had another discussion that the best thing he can do for his health and to prevent cardiovascular disease is to quit smoking.  Hypercholesterolemia I had a discussion about the risks of high cholesterol, prediabetes, HTN and smoking.  We discussed his ASCVD score.  He states he will try to take the pravastatin more often.  Prediabetes We will check his HGBA1c on his yearly exam which is his next visit.    Return in about 18 weeks (around 06/28/2024) for annual.   Crist Fat, MD

## 2024-02-23 NOTE — Assessment & Plan Note (Signed)
 He has cut back from 1.5 ppd to 1/2 ppd.  I have had another discussion that the best thing he can do for his health and to prevent cardiovascular disease is to quit smoking.

## 2024-06-28 ENCOUNTER — Encounter: Payer: Self-pay | Admitting: Internal Medicine

## 2024-06-28 ENCOUNTER — Ambulatory Visit: Admitting: Internal Medicine

## 2024-06-28 VITALS — BP 130/80 | HR 60 | Temp 98.7°F | Resp 17 | Ht 71.0 in | Wt 225.4 lb

## 2024-06-28 DIAGNOSIS — F17210 Nicotine dependence, cigarettes, uncomplicated: Secondary | ICD-10-CM

## 2024-06-28 DIAGNOSIS — E559 Vitamin D deficiency, unspecified: Secondary | ICD-10-CM

## 2024-06-28 DIAGNOSIS — Z87891 Personal history of nicotine dependence: Secondary | ICD-10-CM

## 2024-06-28 DIAGNOSIS — R3911 Hesitancy of micturition: Secondary | ICD-10-CM | POA: Insufficient documentation

## 2024-06-28 DIAGNOSIS — N529 Male erectile dysfunction, unspecified: Secondary | ICD-10-CM

## 2024-06-28 DIAGNOSIS — Z6831 Body mass index (BMI) 31.0-31.9, adult: Secondary | ICD-10-CM | POA: Diagnosis not present

## 2024-06-28 DIAGNOSIS — Z1331 Encounter for screening for depression: Secondary | ICD-10-CM

## 2024-06-28 DIAGNOSIS — E7800 Pure hypercholesterolemia, unspecified: Secondary | ICD-10-CM

## 2024-06-28 DIAGNOSIS — Z0000 Encounter for general adult medical examination without abnormal findings: Secondary | ICD-10-CM

## 2024-06-28 DIAGNOSIS — E66811 Obesity, class 1: Secondary | ICD-10-CM | POA: Insufficient documentation

## 2024-06-28 DIAGNOSIS — I10 Essential (primary) hypertension: Secondary | ICD-10-CM | POA: Diagnosis not present

## 2024-06-28 DIAGNOSIS — R7303 Prediabetes: Secondary | ICD-10-CM

## 2024-06-28 NOTE — Progress Notes (Signed)
 Preventive Screening-Counseling & Management    Eric Molina is a 73 year old Caucasian/White male who presents for his annual wellness exam. He is due for the following health maintenance studies: screening labs. This patient's past medical history Hypertension and HCL.    His last eye exam was 06/22/2024 where he saw ophthalmology where he has had a retinal tear.  Today, he states his vision is doing well.  He had surgery in 2021 for a retinal tear. They are still following him for cataracts. He had a cologuard done in 01/15/2021 that was abnormal. He was sent to GI who performed a colonoscopy on 03/06/2021 and this was normal. There is no family history of colorectal cancer. He is not sure if his dad had prostate cancer. The patient denies any problems with urination.  The patient continues to smoke.  He does exercise by walking and walking on the ellipitcal daily and he is now playing golf.  He does not get yearly flu vaccines. He has never had a pneumonia or shingles vaccines. He has not had the COVID vaccine.  He does not want any vaccinations.  There is no depression, anxiety or memory loss. He is not on an ASA.   The patient is a 73 year old Caucasian/White male who presents for a follow-up evaluation of hypertension.  ON his last visit, his BP was borderline.  The patient has not been checking his blood pressure at home.  The patient's current medications include: losartan  100mg  daily.  He has been on amlodipine in the past but this causes pruritis.  The patient denies any headache, visual changes, dizziness, lightheadness, chest pain, shortness of breath, weakness/numbness, and edema. He reports there have been no other symptoms noted.    Eric Molina returns today for routine followup on his cholesterol.  His TG level was elevated on his last visit in 06/2023 but otherwise his cholesterol was controlled.  This past year. his cholesterol was not controlled and I asked him to start on lipitor.  In  01/2022, his ASCVD score was 29.5% where we started him on pravastatin.  We also noted he had prediabetes.  I wanted him to start pravastatin, cut back on sugars and fat and exercise.   This past year we did switch his pravastatin to lipitor but he admits to me he never did this and he stopped the pravastatin as he heard on the news that it could effect his liver.  He has not read anything about statins.  He has almost completely stopped his pravastatin.   He specifically denies abdominal pain, nausea, vomiting, diarrhea, and fatigue. He remains on dietary management as well as a regular exercise program and takes his pravastatin rarely.   He is fasting in anticipation for labs today.    He does have a history of tobacco abuse where he started smoking at age 73.  He smokes 1/2 ppd and has been continuously cutting back..  He does have a mild chronic cough but no SOB, wheezing, or hemoptysis.  He has tried chantix in the past but this made him have nightmares.  He has never tried nicotine patches.  He has tried nicotine gum but this sticks to his teeth.   The patient is a 73 yo male who returns for followup of his prediabetes.   Since the last visit, there have been no problems. He is currently not on any medications; he is controlling his prediabetes through diet and exercise.  He is now playing  golf once a week.  He specifically denies unexplained abdominal pain, nausea or vomiting. He does not routinely check blood sugars. He came in fasting today in anticipation of lab work.    He also has a history of Vitamin D  deficiency.  We checked his levels in the past and they were low and I asked him to take supplementation which he took for a few months then stopped.    We also saw him last year where he was having problems with libido as well as erectile dysfunction for years.  He has no libido and states he has problems with obtaining an erection.  He has never tried any meds for this in the past.        Are there smokers in your home (other than you)? Yes  Risk Factors Current exercise habits: as above  Dietary issues discussed: none   Depression Screen (Note: if answer to either of the following is Yes, a more complete depression screening is indicated)   Over the past two weeks, have you felt down, depressed or hopeless? No  Over the past two weeks, have you felt little interest or pleasure in doing things? No  Have you lost interest or pleasure in daily life? No  Do you often feel hopeless? No  Do you cry easily over simple problems? No  Activities of Daily Living In your present state of health, do you have any difficulty performing the following activities?:  Driving? No Managing money?  No Feeding yourself? No Getting from bed to chair? No Climbing a flight of stairs? No Preparing food and eating?: No Bathing or showering? No Getting dressed: No Getting to the toilet? No Using the toilet:No Moving around from place to place: No In the past year have you fallen or had a near fall?:No    Hearing Difficulties: Yes Do you often ask people to speak up or repeat themselves? Yes Do you experience ringing or noises in your ears? No Do you have difficulty understanding soft or whispered voices? Yes   Do you feel that you have a problem with memory? No  Do you often misplace items? No  Do you feel safe at home?  Yes  Cognitive Testing  Alert? Yes  Normal Appearance?Yes  Oriented to person? Yes  Place? Yes   Time? Yes  Recall of three objects?  Yes  Can perform simple calculations? Yes  Displays appropriate judgment?Yes  Can read the correct time from a watch face?Yes  Fall Risk Prevention  Any stairs in or around the home? No  If so, are there any without handrails? No  Home free of loose throw rugs in walkways, pet beds, electrical cords, etc? Yes  Adequate lighting in your home to reduce risk of falls? Yes  Use of a cane, walker or w/c? No    Time Up  and Go  Was the test performed? Yes .  Length of time to ambulate 10 feet: 5 sec.   Gait steady and fast without use of assistive device    Advanced Directives have been discussed with the patient? Yes   List the Names of Other Physician/Practitioners you currently use: Patient Care Team: Fleeta Valeria Mayo, MD as PCP - General (Internal Medicine)    Past Medical History:  Diagnosis Date   Abnormal EKG 08/03/2019   Hypertension    Swelling of right foot 08/03/2019    Past Surgical History:  Procedure Laterality Date   TONSILLECTOMY AND ADENOIDECTOMY  VASECTOMY        Current Medications  Current Outpatient Medications  Medication Sig Dispense Refill   cholecalciferol (VITAMIN D3) 25 MCG (1000 UNIT) tablet Take 1,000 Units by mouth daily.     losartan  (COZAAR ) 100 MG tablet Take 1 tablet (100 mg total) by mouth daily. 90 tablet 3   No current facility-administered medications for this visit.    Allergies Amlodipine   Social History Social History   Tobacco Use   Smoking status: Every Day    Current packs/day: 1.00    Average packs/day: 1 pack/day for 45.0 years (45.0 ttl pk-yrs)    Types: Cigarettes   Smokeless tobacco: Never  Substance Use Topics   Alcohol use: Yes    Comment: Occasionally     Review of Systems Review of Systems  Constitutional:  Negative for chills, fever, malaise/fatigue and weight loss.  Eyes:  Negative for blurred vision and double vision.  Respiratory:  Negative for cough, hemoptysis, shortness of breath and wheezing.   Cardiovascular:  Negative for chest pain, palpitations and leg swelling.  Gastrointestinal:  Negative for abdominal pain, blood in stool, constipation, diarrhea, heartburn, melena, nausea and vomiting.  Genitourinary:  Negative for frequency and hematuria.  Musculoskeletal:  Negative for myalgias.  Skin:  Negative for itching and rash.  Neurological:  Negative for dizziness, weakness and headaches.   Endo/Heme/Allergies:  Negative for polydipsia.  Psychiatric/Behavioral:  Negative for depression and memory loss. The patient is not nervous/anxious.      Physical Exam:      Body mass index is 31.44 kg/m. BP 130/80   Pulse 60   Temp 98.7 F (37.1 C)   Resp 17   Ht 5' 11 (1.803 m)   Wt 225 lb 6.4 oz (102.2 kg)   SpO2 97%   BMI 31.44 kg/m   Physical Exam Constitutional:      Appearance: Normal appearance. He is not ill-appearing.  HENT:     Head: Normocephalic and atraumatic.     Right Ear: Tympanic membrane, ear canal and external ear normal.     Left Ear: Tympanic membrane, ear canal and external ear normal.     Nose: Nose normal. No congestion or rhinorrhea.     Mouth/Throat:     Mouth: Mucous membranes are dry.     Pharynx: Oropharynx is clear. No oropharyngeal exudate or posterior oropharyngeal erythema.  Eyes:     General: No scleral icterus.    Conjunctiva/sclera: Conjunctivae normal.     Pupils: Pupils are equal, round, and reactive to light.  Neck:     Vascular: No carotid bruit.  Cardiovascular:     Rate and Rhythm: Normal rate and regular rhythm.     Pulses: Normal pulses.     Heart sounds: No murmur heard.    No friction rub. No gallop.  Pulmonary:     Effort: Pulmonary effort is normal. No respiratory distress.     Breath sounds: No wheezing, rhonchi or rales.  Abdominal:     General: Abdomen is flat. Bowel sounds are normal. There is no distension.     Palpations: Abdomen is soft.     Tenderness: There is no abdominal tenderness.  Musculoskeletal:     Cervical back: Neck supple. No tenderness.     Right lower leg: No edema.     Left lower leg: No edema.  Lymphadenopathy:     Cervical: No cervical adenopathy.  Skin:    General: Skin is warm and dry.  Findings: No rash.  Neurological:     General: No focal deficit present.     Mental Status: He is alert and oriented to person, place, and time.  Psychiatric:        Mood and Affect: Mood  normal.        Behavior: Behavior normal.      Assessment:      Essential hypertension  Hypercholesterolemia  Prediabetes  Cigarette smoker  Vitamin D  deficiency  Erectile dysfunction, unspecified erectile dysfunction type  Urinary hesitancy  BMI 31.0-31.9,adult  Class 1 obesity due to excess calories with serious comorbidity and body mass index (BMI) of 31.0 to 31.9 in adult    Plan:     During the course of the visit the patient was educated and counseled about appropriate screening and preventive services including:   Pneumococcal vaccine  Influenza vaccine Colorectal cancer screening Smoking cessation counseling Advanced directives: discussed  Diet review for nutrition referral? Yes ____  Not Indicated _X___   Patient Instructions (the written plan) was given to the patient.  Essential hypertension His BP is looking good today and is controlled.  We will continue to monitor.  BMI 31.0-31.9,adult I want him to continue to eat healthy, exercise and lose weight.  Cigarette smoker I want him to cut down on smoking.  This was discussed including past medications for cessation.  Class 1 obesity due to excess calories with serious comorbidity and body mass index (BMI) of 31.0 to 31.9 in adult Plan as above.  Hypercholesterolemia He does not take his pravastatin despite his previous ASCVD scoring.  We will check his FLP today.  Prediabetes He controls this with diet and exercise.  We will check a HgBA1c today.   Prevention Health maintenance discussed.  He will need a repeat colonoscopy 10 years after his last exam.  He does not want any vaccines.  We will obtain some yearly labs.    Medicare Attestation I have personally reviewed: The patient's medical and social history Their use of alcohol, tobacco or illicit drugs Their current medications and supplements The patient's functional ability including ADLs,fall risks, home safety risks, cognitive,  and hearing and visual impairment Diet and physical activities Evidence for depression or mood disorders  The patient's weight, height, and BMI have been recorded in the chart.  I have made referrals, counseling, and provided education to the patient based on review of the above and I have provided the patient with a written personalized care plan for preventive services.     Selinda Fleeta Finger, MD   06/28/2024

## 2024-06-28 NOTE — Assessment & Plan Note (Signed)
 I want him to cut down on smoking.  This was discussed including past medications for cessation.

## 2024-06-28 NOTE — Assessment & Plan Note (Signed)
 Plan as above.

## 2024-06-28 NOTE — Assessment & Plan Note (Signed)
 His BP is looking good today and is controlled.  We will continue to monitor.

## 2024-06-28 NOTE — Assessment & Plan Note (Signed)
I want him to continue to eat healthy, exercise and lose weight. 

## 2024-06-28 NOTE — Assessment & Plan Note (Signed)
 He controls this with diet and exercise.  We will check a HgBA1c today.

## 2024-06-28 NOTE — Assessment & Plan Note (Signed)
 He does not take his pravastatin despite his previous ASCVD scoring.  We will check his FLP today.

## 2024-06-29 LAB — CBC WITH DIFFERENTIAL/PLATELET
Basophils Absolute: 0.1 x10E3/uL (ref 0.0–0.2)
Basos: 1 %
EOS (ABSOLUTE): 0.5 x10E3/uL — ABNORMAL HIGH (ref 0.0–0.4)
Eos: 6 %
Hematocrit: 47.6 % (ref 37.5–51.0)
Hemoglobin: 15.7 g/dL (ref 13.0–17.7)
Immature Grans (Abs): 0 x10E3/uL (ref 0.0–0.1)
Immature Granulocytes: 0 %
Lymphocytes Absolute: 2.3 x10E3/uL (ref 0.7–3.1)
Lymphs: 28 %
MCH: 30.8 pg (ref 26.6–33.0)
MCHC: 33 g/dL (ref 31.5–35.7)
MCV: 93 fL (ref 79–97)
Monocytes Absolute: 0.5 x10E3/uL (ref 0.1–0.9)
Monocytes: 6 %
Neutrophils Absolute: 4.9 x10E3/uL (ref 1.4–7.0)
Neutrophils: 59 %
Platelets: 194 x10E3/uL (ref 150–450)
RBC: 5.1 x10E6/uL (ref 4.14–5.80)
RDW: 12.6 % (ref 11.6–15.4)
WBC: 8.2 x10E3/uL (ref 3.4–10.8)

## 2024-06-29 LAB — CMP14 + ANION GAP
ALT: 36 IU/L (ref 0–44)
AST: 29 IU/L (ref 0–40)
Albumin: 4.3 g/dL (ref 3.8–4.8)
Alkaline Phosphatase: 78 IU/L (ref 44–121)
Anion Gap: 18 mmol/L (ref 10.0–18.0)
BUN/Creatinine Ratio: 13 (ref 10–24)
BUN: 16 mg/dL (ref 8–27)
Bilirubin Total: 0.5 mg/dL (ref 0.0–1.2)
CO2: 21 mmol/L (ref 20–29)
Calcium: 9.6 mg/dL (ref 8.6–10.2)
Chloride: 99 mmol/L (ref 96–106)
Creatinine, Ser: 1.19 mg/dL (ref 0.76–1.27)
Globulin, Total: 3.6 g/dL (ref 1.5–4.5)
Glucose: 110 mg/dL — ABNORMAL HIGH (ref 70–99)
Potassium: 5 mmol/L (ref 3.5–5.2)
Sodium: 138 mmol/L (ref 134–144)
Total Protein: 7.9 g/dL (ref 6.0–8.5)
eGFR: 65 mL/min/1.73 (ref >59)

## 2024-06-29 LAB — LIPID PANEL
Chol/HDL Ratio: 6.4 ratio — ABNORMAL HIGH (ref 0.0–5.0)
Cholesterol, Total: 178 mg/dL (ref 100–199)
HDL: 28 mg/dL — ABNORMAL LOW (ref >39)
LDL Chol Calc (NIH): 112 mg/dL — ABNORMAL HIGH (ref 0–99)
Triglycerides: 217 mg/dL — ABNORMAL HIGH (ref 0–149)
VLDL Cholesterol Cal: 38 mg/dL (ref 5–40)

## 2024-06-29 LAB — HEMOGLOBIN A1C
Est. average glucose Bld gHb Est-mCnc: 123 mg/dL
Hgb A1c MFr Bld: 5.9 % — ABNORMAL HIGH (ref 4.8–5.6)

## 2024-06-29 LAB — TSH: TSH: 2.89 u[IU]/mL (ref 0.450–4.500)

## 2024-06-29 LAB — PSA: Prostate Specific Ag, Serum: 3.1 ng/mL (ref 0.0–4.0)

## 2024-07-09 ENCOUNTER — Ambulatory Visit: Payer: Self-pay

## 2024-07-09 MED ORDER — PRAVASTATIN SODIUM 40 MG PO TABS: 40.0000 mg | ORAL_TABLET | Freq: Every day | ORAL | 3 refills | Status: DC

## 2024-07-09 NOTE — Progress Notes (Signed)
 Patient called.  Patient aware.  I have called and informed the patient  His labs look good but his cholesterol is not controlled.  He needs to restart and take his pravastatin  nightly .  Pt aware, also refilled his Pravastatin .

## 2024-10-25 ENCOUNTER — Ambulatory Visit: Admitting: Internal Medicine

## 2024-10-25 ENCOUNTER — Encounter: Payer: Self-pay | Admitting: Internal Medicine

## 2024-10-25 VITALS — BP 138/94 | HR 60 | Temp 98.2°F | Resp 16 | Ht 71.0 in | Wt 222.2 lb

## 2024-10-25 DIAGNOSIS — R7303 Prediabetes: Secondary | ICD-10-CM

## 2024-10-25 DIAGNOSIS — I10 Essential (primary) hypertension: Secondary | ICD-10-CM | POA: Diagnosis not present

## 2024-10-25 DIAGNOSIS — E7800 Pure hypercholesterolemia, unspecified: Secondary | ICD-10-CM | POA: Diagnosis not present

## 2024-10-25 NOTE — Assessment & Plan Note (Signed)
 He controls this with diet and exercise.  We will check his HgBa1c on his next visit.

## 2024-10-25 NOTE — Progress Notes (Signed)
 Office Visit  Subjective   Patient ID: Eric Molina   DOB: 1951/01/25   Age: 73 y.o.   MRN: [id number]   Chief Complaint Chief Complaint  Patient presents with   Follow-up    4 Month follow up     History of Present Illness The patient is a 73 year old Caucasian/White male who presents for a follow-up evaluation of hypertension.  He has had some borderline BP's this past year.  The patient has not been checking his blood pressure at home.  The patient's current medications include: losartan  100mg  daily.  He has been on amlodipine in the past but this causes pruritis.  The patient denies any headache, visual changes, dizziness, lightheadness, chest pain, shortness of breath, weakness/numbness, and edema. He reports there have been no other symptoms noted.    Lennell Porto returns today for routine followup on his cholesterol.  His TG level was elevated on 06/2023 but otherwise his cholesterol was controlled.  This past year. his cholesterol was not controlled and I asked him to start on lipitor.  In 01/2022, his ASCVD score was 29.5% where we started him on pravastatin .  We also noted he had prediabetes.  I wanted him to start pravastatin , cut back on sugars and fat and exercise.   This past year we did switch his pravastatin  to lipitor but he admits to me he never did this and he stopped the pravastatin  as he heard on the news that it could effect his liver.  He has not read anything about statins.  He had stopped his pravastatin  but on his last visit 3 months ago I asked him to restart this.  However, he began having myalgias and he therefore stopped the pravastatin  again.  He specifically denies abdominal pain, nausea, vomiting, diarrhea, and fatigue. He remains on dietary management as well as a regular exercise program.   He is fasting in anticipation for labs today.     The patient is a 73 yo male who returns for followup of his prediabetes.   Since the last visit, there have been no problems.  He is currently not on any medications; he is controlling his prediabetes through diet and exercise.  He is now playing golf once a week.  He specifically denies unexplained abdominal pain, nausea or vomiting. He does not routinely check blood sugars.  His last HgBa1c was done 3 months ago and was 5.9%.  He came in fasting today in anticipation of lab work.       Past Medical History Past Medical History:  Diagnosis Date   Abnormal EKG 08/03/2019   Hypertension    Swelling of right foot 08/03/2019     Allergies Allergies  Allergen Reactions   Amlodipine Rash     Medications  Current Outpatient Medications:    cholecalciferol (VITAMIN D3) 25 MCG (1000 UNIT) tablet, Take 1,000 Units by mouth daily., Disp: , Rfl:    losartan  (COZAAR ) 100 MG tablet, Take 1 tablet (100 mg total) by mouth daily., Disp: 90 tablet, Rfl: 3   Review of Systems Review of Systems  Constitutional:  Negative for chills, fever and malaise/fatigue.  Eyes:  Negative for blurred vision and double vision.  Respiratory:  Negative for cough and shortness of breath.   Cardiovascular:  Negative for chest pain, palpitations and leg swelling.  Gastrointestinal:  Negative for abdominal pain, constipation, diarrhea, nausea and vomiting.  Genitourinary:  Negative for frequency.  Musculoskeletal:  Negative for myalgias.  Skin:  Negative for  itching and rash.  Neurological:  Negative for dizziness, weakness and headaches.  Endo/Heme/Allergies:  Negative for polydipsia.       Objective:    Vitals BP (!) 138/94   Pulse 60   Temp 98.2 F (36.8 C) (Temporal)   Resp 16   Ht 5' 11 (1.803 m)   Wt 222 lb 3.2 oz (100.8 kg)   SpO2 96%   BMI 30.99 kg/m    Physical Examination Physical Exam Constitutional:      Appearance: Normal appearance. He is not ill-appearing.  Cardiovascular:     Rate and Rhythm: Normal rate and regular rhythm.     Pulses: Normal pulses.     Heart sounds: No murmur heard.    No friction  rub. No gallop.  Pulmonary:     Effort: Pulmonary effort is normal. No respiratory distress.     Breath sounds: No wheezing, rhonchi or rales.  Abdominal:     General: Abdomen is flat. Bowel sounds are normal. There is no distension.     Palpations: Abdomen is soft.     Tenderness: There is no abdominal tenderness.  Musculoskeletal:     Right lower leg: No edema.     Left lower leg: No edema.  Skin:    General: Skin is warm and dry.     Findings: No rash.  Neurological:     General: No focal deficit present.     Mental Status: He is alert and oriented to person, place, and time.  Psychiatric:        Mood and Affect: Mood normal.        Assessment & Plan:   Essential hypertension His BP is borderline at this time.   We will continue his losartan  and continue to monitor his BP.  Prediabetes He controls this with diet and exercise.  We will check his HgBa1c on his next visit.  Hypercholesterolemia We will keep him off a statin for now and recheck his FLP on his next visit.  We could try to introduce atorvastatin  on his next visit if his cholesterol remains elevated.    Return in about 3 months (around 01/25/2025).   Selinda Fleeta Finger, MD

## 2024-10-25 NOTE — Assessment & Plan Note (Signed)
 We will keep him off a statin for now and recheck his FLP on his next visit.  We could try to introduce atorvastatin  on his next visit if his cholesterol remains elevated.

## 2024-10-25 NOTE — Assessment & Plan Note (Signed)
 His BP is borderline at this time.   We will continue his losartan  and continue to monitor his BP.

## 2025-01-08 ENCOUNTER — Other Ambulatory Visit: Payer: Self-pay

## 2025-01-08 MED ORDER — LOSARTAN POTASSIUM 100 MG PO TABS: 100.0000 mg | ORAL_TABLET | Freq: Every day | ORAL | 0 refills | Status: AC
